# Patient Record
Sex: Male | Born: 1970 | Race: Black or African American | Hispanic: No | Marital: Married | Smoking: Never smoker
Health system: Southern US, Community
[De-identification: ages and names within clinical notes are randomized; demographics above are authoritative.]

## PROBLEM LIST (undated history)

## (undated) DIAGNOSIS — J4520 Mild intermittent asthma, uncomplicated: Secondary | ICD-10-CM

## (undated) HISTORY — DX: Mild intermittent asthma, uncomplicated: J45.20

---

## 2010-10-13 ENCOUNTER — Other Ambulatory Visit: Payer: Self-pay | Admitting: Family Medicine

## 2010-10-13 ENCOUNTER — Ambulatory Visit
Admission: RE | Admit: 2010-10-13 | Discharge: 2010-10-13 | Disposition: A | Discharge status: Home / Self Care | Payer: BC Managed Care – PPO | Source: Ambulatory Visit | Attending: Family Medicine | Admitting: Family Medicine

## 2010-10-13 DIAGNOSIS — M25562 Pain in left knee: Secondary | ICD-10-CM

## 2012-11-13 ENCOUNTER — Encounter (HOSPITAL_BASED_OUTPATIENT_CLINIC_OR_DEPARTMENT_OTHER): Payer: Self-pay | Admitting: *Deleted

## 2012-11-13 ENCOUNTER — Emergency Department (HOSPITAL_BASED_OUTPATIENT_CLINIC_OR_DEPARTMENT_OTHER): Payer: BC Managed Care – PPO

## 2012-11-13 ENCOUNTER — Emergency Department (HOSPITAL_BASED_OUTPATIENT_CLINIC_OR_DEPARTMENT_OTHER)
Admission: EM | Admit: 2012-11-13 | Discharge: 2012-11-13 | Disposition: A | Discharge status: Home / Self Care | Payer: BC Managed Care – PPO | Attending: Emergency Medicine | Admitting: Emergency Medicine

## 2012-11-13 DIAGNOSIS — S0990XA Unspecified injury of head, initial encounter: Secondary | ICD-10-CM

## 2012-11-13 DIAGNOSIS — Y9389 Activity, other specified: Secondary | ICD-10-CM | POA: Insufficient documentation

## 2012-11-13 DIAGNOSIS — Y92410 Unspecified street and highway as the place of occurrence of the external cause: Secondary | ICD-10-CM | POA: Insufficient documentation
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
## 2013-07-29 ENCOUNTER — Encounter (HOSPITAL_COMMUNITY): Payer: Self-pay | Admitting: Emergency Medicine

## 2013-07-29 ENCOUNTER — Emergency Department (INDEPENDENT_AMBULATORY_CARE_PROVIDER_SITE_OTHER)
Admission: EM | Admit: 2013-07-29 | Discharge: 2013-07-29 | Disposition: A | Discharge status: Nursing Home (Includes ICF) | Payer: BC Managed Care – PPO | Source: Home / Self Care | Attending: Family Medicine | Admitting: Family Medicine

## 2013-07-29 ENCOUNTER — Emergency Department (HOSPITAL_COMMUNITY)
Admission: EM | Admit: 2013-07-29 | Discharge: 2013-07-29 | Disposition: A | Discharge status: Home / Self Care | Payer: BC Managed Care – PPO | Attending: Emergency Medicine | Admitting: Emergency Medicine

## 2013-07-29 ENCOUNTER — Emergency Department (HOSPITAL_COMMUNITY): Payer: BC Managed Care – PPO

## 2013-07-29 DIAGNOSIS — R079 Chest pain, unspecified: Secondary | ICD-10-CM

## 2013-07-29 DIAGNOSIS — R0789 Other chest pain: Secondary | ICD-10-CM | POA: Insufficient documentation

## 2013-07-29 LAB — CBC WITH DIFFERENTIAL/PLATELET
BASOS PCT: 0 % (ref 0–1)
Basophils Absolute: 0 10*3/uL (ref 0.0–0.1)
EOS PCT: 1 % (ref 0–5)
Eosinophils Absolute: 0.1 10*3/uL (ref 0.0–0.7)
HCT: 42 % (ref 39.0–52.0)
HEMOGLOBIN: 14.6 g/dL (ref 13.0–17.0)
LYMPHS PCT: 13 % (ref 12–46)
Lymphs Abs: 1.2 10*3/uL (ref 0.7–4.0)
MCH: 30.7 pg (ref 26.0–34.0)
MCHC: 34.8 g/dL (ref 30.0–36.0)
MCV: 88.4 fL (ref 78.0–100.0)
MONO ABS: 0.7 10*3/uL (ref 0.1–1.0)
Monocytes Relative: 7 % (ref 3–12)
Neutro Abs: 7.3 10*3/uL (ref 1.7–7.7)
Neutrophils Relative %: 79 % — ABNORMAL HIGH (ref 43–77)
PLATELETS: 257 10*3/uL (ref 150–400)
RBC: 4.75 MIL/uL (ref 4.22–5.81)
RDW: 13.2 % (ref 11.5–15.5)
WBC: 9.3 10*3/uL (ref 4.0–10.5)

## 2013-07-29 LAB — I-STAT CHEM 8, ED
BUN: 9 mg/dL (ref 6–23)
CREATININE: 1.3 mg/dL (ref 0.50–1.35)
Calcium, Ion: 1.24 mmol/L — ABNORMAL HIGH (ref 1.12–1.23)
Chloride: 102 mEq/L (ref 96–112)
Glucose, Bld: 103 mg/dL — ABNORMAL HIGH (ref 70–99)
HCT: 43 % (ref 39.0–52.0)
HEMOGLOBIN: 14.6 g/dL (ref 13.0–17.0)
POTASSIUM: 4.5 meq/L (ref 3.7–5.3)
Sodium: 142 mEq/L (ref 137–147)
TCO2: 28 mmol/L (ref 0–100)

## 2013-07-29 LAB — TROPONIN I: Troponin I: <0.3 ng/mL (ref <0.30)

## 2013-07-29 LAB — I-STAT TROPONIN, ED: TROPONIN I, POC: 0.01 ng/mL (ref 0.00–0.08)

## 2013-07-29 LAB — D-DIMER, QUANTITATIVE: D-Dimer, Quant: <0.27 ug/mL-FEU (ref 0.00–0.48)

## 2013-07-29 MED ORDER — NITROGLYCERIN 0.4 MG SL SUBL
SUBLINGUAL_TABLET | SUBLINGUAL | Status: AC
  Filled 2013-07-29: qty 1

## 2013-07-29 MED ORDER — ASPIRIN 81 MG PO CHEW
CHEWABLE_TABLET | ORAL | Status: AC
  Filled 2013-07-29: qty 4

## 2013-07-29 MED ORDER — SODIUM CHLORIDE 0.9 % IV SOLN
Freq: Once | INTRAVENOUS | Status: AC
  Administered 2013-07-29: 11:00:00 via INTRAVENOUS

## 2013-07-29 MED ORDER — GI COCKTAIL ~~LOC~~
30.0000 mL | Freq: Once | ORAL | Status: AC
  Administered 2013-07-29: 30 mL via ORAL
  Filled 2013-07-29: qty 30

## 2013-07-29 MED ORDER — ASPIRIN 81 MG PO CHEW
324.0000 mg | CHEWABLE_TABLET | Freq: Once | ORAL | Status: AC
  Administered 2013-07-29: 324 mg via ORAL

## 2013-07-29 MED ORDER — NITROGLYCERIN 0.4 MG SL SUBL
0.4000 mg | SUBLINGUAL_TABLET | SUBLINGUAL | Status: DC | PRN
  Administered 2013-07-29: 0.4 mg via SUBLINGUAL

## 2013-07-29 NOTE — ED Notes (Signed)
Per pt, pt reports having chest pain.  Was woken by the pain, initially thought it was gas.  Pt reports pain is located lower chest/upper abdomen.  Pt describes pain as tight and sharp.  Sharpness occurred around 9am and lasted about half hour.  Reports currently just feeling tight and uncomfortable (5/10 pain rating).  Denies n/v/d, diaphoresis.  Pt reports some tremors and clamminess, but it passed within a few minutes (pt attributed it to anxiety).  Pt denies hx of chest pain, heart problems.  Denies hx htn and DM.  Pt reports difficulty taking a deep breath.  No apparent distress.  Skin warm and dry.  Respirations equal and unlabored.  [facility]

## 2013-07-29 NOTE — Discharge Instructions (Signed)
Chest Pain (Nonspecific) °It is often hard to give a specific diagnosis for the cause of chest pain. There is always a chance that your pain could be related to something serious, such as a heart attack or a blood clot in the lungs. You need to follow up with your caregiver for further evaluation. °CAUSES  °· Heartburn. °· Pneumonia or bronchitis. °· Anxiety or stress. °· Inflammation around your heart (pericarditis) or lung (pleuritis or pleurisy). °· A blood clot in the lung. °· A collapsed lung (pneumothorax). It can develop suddenly on its own (spontaneous pneumothorax) or from injury (trauma) to the chest. °· Shingles infection (herpes zoster virus). °The chest wall is composed of bones, muscles, and cartilage. Any of these can be the source of the pain. °· The bones can be bruised by injury. °· The muscles or cartilage can be strained by coughing or overwork. °· The cartilage can be affected by inflammation and become sore (costochondritis). °DIAGNOSIS  °Lab tests or other studies, such as X-rays, electrocardiography, stress testing, or cardiac imaging, may be needed to find the cause of your pain.  °TREATMENT  °· Treatment depends on what may be causing your chest pain. Treatment may include: °· Acid blockers for heartburn. °· Anti-inflammatory medicine. °· Pain medicine for inflammatory conditions. °· Antibiotics if an infection is present. °· You may be advised to change lifestyle habits. This includes stopping smoking and avoiding alcohol, caffeine, and chocolate. °· You may be advised to keep your head raised (elevated) when sleeping. This reduces the chance of acid going backward from your stomach into your esophagus. °· Most of the time, nonspecific chest pain will improve within 2 to 3 days with rest and mild pain medicine. °HOME CARE INSTRUCTIONS  °· If antibiotics were prescribed, take your antibiotics as directed. Finish them even if you start to feel better. °· For the next few days, avoid physical  activities that bring on chest pain. Continue physical activities as directed. °· Do not smoke. °· Avoid drinking alcohol. °· Only take over-the-counter or prescription medicine for pain, discomfort, or fever as directed by your caregiver. °· Follow your caregiver's suggestions for further testing if your chest pain does not go away. °· Keep any follow-up appointments you made. If you do not go to an appointment, you could develop lasting (chronic) problems with pain. If there is any problem keeping an appointment, you must call to reschedule. °SEEK MEDICAL CARE IF:  °· You think you are having problems from the medicine you are taking. Read your medicine instructions carefully. °· Your chest pain does not go away, even after treatment. °· You develop a rash with blisters on your chest. °SEEK IMMEDIATE MEDICAL CARE IF:  °· You have increased chest pain or pain that spreads to your arm, neck, jaw, back, or abdomen. °· You develop shortness of breath, an increasing cough, or you are coughing up blood. °· You have severe back or abdominal pain, feel nauseous, or vomit. °· You develop severe weakness, fainting, or chills. °· You have a fever. °THIS IS AN EMERGENCY. Do not wait to see if the pain will go away. Get medical help at once. Call your local emergency services (911 in [place] Do not drive yourself to the hospital. °MAKE SURE YOU:  °· Understand these instructions. °· Will watch your condition. °· Will get help right away if you are not doing well or get worse. °Document Released: 02/04/2005 Document Revised: 07/20/2011 Document Reviewed: 12/01/2007 °ExitCare® Patient Information ©2014 [employer]   [employer]. ° °

## 2013-07-29 NOTE — ED Notes (Signed)
Patient has epigastric pain that woke him around 2:30 am.  No nausea, no vomiting, no diarrhea.  Initially reported as constant, but reports several times this am that pain subsided.  Pain is more intense now than it was initially.  Reports normal bm this am and did improve .  Pain is in front of body, no pain in back.  Pain is described as "just there", no sharp.  Pain makes it difficult to take a deep breath.  No diaphoresis.

## 2013-07-29 NOTE — ED Notes (Signed)
Notified [facility] 

## 2013-07-29 NOTE — ED Provider Notes (Signed)
CSN: [id number]     Arrival date & time 07/29/13  1133 History   First MD Initiated Contact with Patient 07/29/13 1246     Chief Complaint  Patient presents with  . Chest Pain     (Consider location/radiation/quality/duration/timing/severity/associated sxs/prior Treatment) HPI Comments: Patient presents with chest pain. He states that started about 2 to this morning and is been intermittent since that time. Hasn't ever completely gone away but it waxes and wanes in intensity. He describes it as an aching across his upper abdomen that radiated to his right shoulder. He states right now he has only a very mild ache in his right shoulder area. He denies any shortness of breath dizziness, nausea, or diaphoresis. There is no pleuritic nature to the pain. He denies any leg pain or swelling. He describes as an aching across his chest. It's not related to movement not related to breathing. He took some gas pills without relief. He was first seen in urgent care Center and transferred over here. He was given baby aspirin nitroglycerin at urgent care Center. He did not note any improvement with the symptoms after the nitroglycerin. He denies any known family history of early heart disease. He has a slight cholesterol problem he states. He denies any history of hypertension or diabetes. He denies any tobacco use.  Patient is a 43 y.o. male presenting with chest pain.  Chest Pain Associated symptoms: no abdominal pain, no back pain, no cough, no diaphoresis, no dizziness, no fatigue, no fever, no headache, no nausea, no numbness, no shortness of breath, not vomiting and no weakness     History reviewed. No pertinent past medical history. History reviewed. No pertinent past surgical history. History reviewed. No pertinent family history. History  Substance Use Topics  . Smoking status: Never Smoker   . Smokeless tobacco: Not on file  . Alcohol Use: No    Review of Systems  Constitutional: Negative for  fever, chills, diaphoresis and fatigue.  HENT: Negative for congestion, rhinorrhea and sneezing.   Eyes: Negative.   Respiratory: Negative for cough, chest tightness and shortness of breath.   Cardiovascular: Positive for chest pain. Negative for leg swelling.  Gastrointestinal: Negative for nausea, vomiting, abdominal pain, diarrhea and blood in stool.  Genitourinary: Negative for frequency, hematuria, flank pain and difficulty urinating.  Musculoskeletal: Negative for arthralgias and back pain.  Skin: Negative for rash.  Neurological: Negative for dizziness, speech difficulty, weakness, numbness and headaches.      Allergies  Fruit & vegetable daily  Home Medications   Current Outpatient Rx  Name  Route  Sig  Dispense  Refill  . OVER THE COUNTER MEDICATION      Over the counter gas medicine         . Pseudoephedrine-Ibuprofen (ADVIL COLD/SINUS PO)   Oral   Take 1 tablet by mouth 2 (two) times daily as needed (for allergies).          BP 126/86  Pulse 99  Temp(Src) 98.7 F (37.1 C) (Oral)  Resp 21  Ht 5\' 8"  (1.727 m)  Wt 176 lb (79.833 kg)  BMI 26.77 kg/m2  SpO2 98% Physical Exam  Constitutional: He is oriented to person, place, and time. He appears well-developed and well-nourished.  HENT:  Head: Normocephalic and atraumatic.  Eyes: Pupils are equal, round, and reactive to light.  Neck: Normal range of motion. Neck supple.  Cardiovascular: Normal rate, regular rhythm and normal heart sounds.   Pulmonary/Chest: Effort normal and breath sounds  normal. No respiratory distress. He has no wheezes. He has no rales. He exhibits no tenderness.  Abdominal: Soft. Bowel sounds are normal. There is no tenderness. There is no rebound and no guarding.  Musculoskeletal: Normal range of motion. He exhibits no edema.  Lymphadenopathy:    He has no cervical adenopathy.  Neurological: He is alert and oriented to person, place, and time.  Skin: Skin is warm and dry. No rash  noted.  Psychiatric: He has a normal mood and affect.    ED Course  Procedures (including critical care time) Labs Review Results for orders placed during the hospital encounter of 07/29/13  CBC WITH DIFFERENTIAL      Result Value Ref Range   WBC 9.3  4.0 - 10.5 K/uL   RBC 4.75  4.22 - 5.81 MIL/uL   Hemoglobin 14.6  13.0 - 17.0 g/dL   HCT 16.1  09.6 - 04.5 %   MCV 88.4  78.0 - 100.0 fL   MCH 30.7  26.0 - 34.0 pg   MCHC 34.8  30.0 - 36.0 g/dL   RDW 40.9  81.1 - 91.4 %   Platelets 257  150 - 400 K/uL   Neutrophils Relative % 79 (*) 43 - 77 %   Lymphocytes Relative 13  12 - 46 %   Monocytes Relative 7  3 - 12 %   Eosinophils Relative 1  0 - 5 %   Basophils Relative 0  0 - 1 %   Neutro Abs 7.3  1.7 - 7.7 K/uL   Lymphs Abs 1.2  0.7 - 4.0 K/uL   Monocytes Absolute 0.7  0.1 - 1.0 K/uL   Eosinophils Absolute 0.1  0.0 - 0.7 K/uL   Basophils Absolute 0.0  0.0 - 0.1 K/uL   Smear Review MORPHOLOGY UNREMARKABLE    TROPONIN I      Result Value Ref Range   Troponin I <0.30  <0.30 ng/mL  D-DIMER, QUANTITATIVE      Result Value Ref Range   D-Dimer, Quant <0.27  0.00 - 0.48 ug/mL-FEU  I-STAT CHEM 8, ED      Result Value Ref Range   Sodium 142  137 - 147 mEq/L   Potassium 4.5  3.7 - 5.3 mEq/L   Chloride 102  96 - 112 mEq/L   BUN 9  6 - 23 mg/dL   Creatinine, Ser 7.82  0.50 - 1.35 mg/dL   Glucose, Bld 956 (*) 70 - 99 mg/dL   Calcium, Ion 2.13 (*) 1.12 - 1.23 mmol/L   TCO2 28  0 - 100 mmol/L   Hemoglobin 14.6  13.0 - 17.0 g/dL   HCT 08.6  57.8 - 46.9 %  I-STAT TROPOININ, ED      Result Value Ref Range   Troponin i, poc 0.01  0.00 - 0.08 ng/mL   Comment 3            Dg Chest 2 View  07/29/2013   CLINICAL DATA:  Chest/left arm pain, shortness of breath  EXAM: CHEST  2 VIEW  COMPARISON:  None.  FINDINGS: Lungs are clear. No pleural effusion or pneumothorax.  The heart is normal in size.  Visualized osseous structures are within normal limits.  IMPRESSION: No evidence of acute  cardiopulmonary disease.   Electronically Signed   By: Charline Bills M.D.   On: 07/29/2013 13:13    Date: 07/29/2013  Rate: 73  Rhythm: normal sinus rhythm  QRS Axis: normal  Intervals: normal  ST/T Wave  abnormalities: Jpoint elevation V2.  Conduction Disutrbances:none  Narrative Interpretation:   Old EKG Reviewed: none available    Imaging Review Dg Chest 2 View  07/29/2013   CLINICAL DATA:  Chest/left arm pain, shortness of breath  EXAM: CHEST  2 VIEW  COMPARISON:  None.  FINDINGS: Lungs are clear. No pleural effusion or pneumothorax.  The heart is normal in size.  Visualized osseous structures are within normal limits.  IMPRESSION: No evidence of acute cardiopulmonary disease.   Electronically Signed   By: Charline Bills M.D.   On: 07/29/2013 13:13     EKG Interpretation None      MDM   Final diagnoses:  Chest pain    Pt presents with atypical CP.  No other associated symptoms.  To me, he denied n/v/diaphoresis, SOB.  Pain worse with inspiration, but on other findings suggestive of PE.  No PTX, pneumonia.  Pt with a HEART score of 3 (low risk).  2 EKGs done and unchanged.  He has elevation in one lead that appears to be j point elevation.  No reciprocal changes.  No change in pain with nitro, GI cocktail.  Pt with minimal symptoms now.  Had 2 neg troponins.  Will d/c.  Pt has good f/u.  Will f/u with his PCP on Monday.  Given strict return precautions.    Rolan Bucco, MD 07/29/13 2015

## 2013-07-29 NOTE — ED Provider Notes (Signed)
CSN: [id number]     Arrival date & time 07/29/13  [id number] History   First MD Initiated Contact with Patient 07/29/13 1052     Chief Complaint  Patient presents with  . Abdominal Pain   (Consider location/radiation/quality/duration/timing/severity/associated sxs/prior Treatment) Patient is a 43 y.o. male presenting with chest pain. The history is provided by the patient.  Chest Pain Pain location:  Epigastric and substernal area Pain quality: aching and pressure   Pain radiates to:  Precordial region, L shoulder and R shoulder (in band-like distribution across upper abdomen) Pain radiates to the back: no   Pain severity:  Moderate Onset quality:  Gradual (Woke patient from sleep at 2:30am.) Progression:  Waxing and waning Chronicity:  New Ineffective treatments:  Antacids (Tried dose of simethicone this morning.) Associated symptoms: diaphoresis   Associated symptoms: no dizziness, no heartburn, no nausea, no near-syncope, no palpitations, no shortness of breath, not vomiting and no weakness   Associated symptoms comment:  Feels "clammy" when symptoms intensify.   History reviewed. No pertinent past medical history. History reviewed. No pertinent past surgical history. No family history on file. History  Substance Use Topics  . Smoking status: Never Smoker   . Smokeless tobacco: Not on file  . Alcohol Use: No    Review of Systems  Constitutional: Positive for diaphoresis.  Respiratory: Negative for shortness of breath.   Cardiovascular: Positive for chest pain. Negative for palpitations and near-syncope.  Gastrointestinal: Negative for heartburn, nausea and vomiting.  Neurological: Negative for dizziness and weakness.  All other systems reviewed and are negative.    Allergies  Review of patient's allergies indicates no known allergies.  Home Medications   Current Outpatient Rx  Name  Route  Sig  Dispense  Refill  . OVER THE COUNTER MEDICATION      Over the counter gas  medicine          BP 134/93  Pulse 81  Temp(Src) 97.1 F (36.2 C) (Oral)  Resp 20  SpO2 99% Physical Exam  Nursing note and vitals reviewed. Constitutional: He is oriented to person, place, and time. He appears well-developed and well-nourished. No distress.  HENT:  Head: Normocephalic and atraumatic.  Eyes: Conjunctivae are normal.  Neck: Normal range of motion. Neck supple. No JVD present.  Cardiovascular: Normal rate, regular rhythm and normal heart sounds.   Pulmonary/Chest: Effort normal and breath sounds normal. No respiratory distress. He has no wheezes.  Abdominal: Soft. Bowel sounds are normal. He exhibits no distension. There is no tenderness.  Musculoskeletal: Normal range of motion. He exhibits no edema.  Neurological: He is alert and oriented to person, place, and time.  Skin: Skin is warm and dry.  Psychiatric: He has a normal mood and affect. His behavior is normal.    ED Course  Procedures (including critical care time) Labs Review Labs Reviewed - No data to display Imaging Review No results found.   MDM   1. Chest pain   43 y/o AA male with band-like pressure/discomfort at lower chest and upper abdomen since 2:30 am. TIMI= 0. No family hx of early heart disease. ECG: NSR @ 80 bpm with ST elevation v1-v5 (?J point elevation?). ECG reviewed with Dr. Denyse Amassorey and [facility].     Jess BartersJennifer Lee LinndalePresson, [place] 07/29/13 1101

## 2013-07-31 NOTE — ED Provider Notes (Signed)
Medical screening examination/treatment/procedure(s) were performed by a resident physician or non-physician practitioner and as the supervising physician I was immediately available for consultation/collaboration.  Evan Corey, MD    Evan S Corey, MD 07/31/13 0758 

## 2014-05-29 ENCOUNTER — Other Ambulatory Visit: Payer: Self-pay | Admitting: Family Medicine

## 2014-05-29 DIAGNOSIS — M545 Low back pain: Secondary | ICD-10-CM

## 2014-06-09 ENCOUNTER — Other Ambulatory Visit: Payer: BC Managed Care – PPO

## 2014-06-12 ENCOUNTER — Ambulatory Visit
Admission: RE | Admit: 2014-06-12 | Discharge: 2014-06-12 | Disposition: A | Discharge status: Home / Self Care | Payer: BC Managed Care – PPO | Source: Ambulatory Visit | Attending: Family Medicine | Admitting: Family Medicine

## 2014-06-12 DIAGNOSIS — M545 Low back pain: Secondary | ICD-10-CM

## 2015-06-28 ENCOUNTER — Encounter: Payer: Self-pay | Admitting: Allergy and Immunology

## 2015-06-28 ENCOUNTER — Ambulatory Visit (INDEPENDENT_AMBULATORY_CARE_PROVIDER_SITE_OTHER): Payer: BC Managed Care – PPO | Admitting: Allergy and Immunology

## 2015-06-28 VITALS — BP 114/70 | HR 76 | Temp 98.2°F | Resp 16 | Ht 68.9 in | Wt 181.2 lb

## 2015-06-28 DIAGNOSIS — R05 Cough: Secondary | ICD-10-CM

## 2015-06-28 DIAGNOSIS — R062 Wheezing: Secondary | ICD-10-CM | POA: Diagnosis not present

## 2015-06-28 DIAGNOSIS — H1010 Acute atopic conjunctivitis, unspecified eye: Secondary | ICD-10-CM | POA: Diagnosis not present

## 2015-06-28 DIAGNOSIS — R059 Cough, unspecified: Secondary | ICD-10-CM

## 2015-06-28 DIAGNOSIS — J309 Allergic rhinitis, unspecified: Secondary | ICD-10-CM | POA: Diagnosis not present

## 2015-06-28 MED ORDER — MOMETASONE FUROATE 50 MCG/ACT NA SUSP: NASAL | Status: DC

## 2015-06-28 MED ORDER — ALBUTEROL SULFATE 108 (90 BASE) MCG/ACT IN AEPB: 90.0000 ug | INHALATION_SPRAY | RESPIRATORY_TRACT | Status: DC | PRN

## 2015-06-28 MED ORDER — LEVOCETIRIZINE DIHYDROCHLORIDE 5 MG PO TABS: 5.0000 mg | ORAL_TABLET | Freq: Every evening | ORAL | Status: DC

## 2015-06-28 NOTE — Patient Instructions (Signed)
Take Home Sheet  1. Avoidance: Mite, Mold and Pollen   2. Antihistamine: Xyzal  by mouth once daily for runny nose or itching.   3. Nasal Spray:  Nasonex 1-2 spray(s) each nostril once daily for stuffy nose or drainage.    4. Inhalers:  Rescue: ProAir respiclick 2 puffs every 4 hours as needed for cough or wheeze.       -May use 2 puffs 10-20 minutes prior to exercise.    5. Eye Drops: Zaditor one drop(s) each eye twice daily for itchy eyes as needed.   6. Other: Begin Singulair  each evening.       Information on allergy injections.  7. Nasal Saline wash each evening at shower time and as needed after mowing grass   8. Follow up Visit: 1-2 months or sooner if needed.   Websites that have reliable Patient information: 1. American Academy of Asthma, Allergy, & Immunology: www.aaaai.org 2. [employer] Allergy Network: www.foodallergy.org 3. Mothers of Asthmatics: www.aanma.org 4. [facility]: https://www.strong.com/ 5. American College of Allergy, Asthma, & Immunology: BiggerRewards.is or www.acaai.org

## 2015-06-30 NOTE — Progress Notes (Signed)
NEW PATIENT NOTE  RE: Shane Barnett MRN: [id number] DOB: 1970/06/09 [facility] [place] [address] [place] [address] Date of Office Visit: 06/28/2015   Subjective:  Shane Barnett is a 45 y.o. male who presents today for Nasal Congestion  Assessment:   1. Cough, intermittent, afebrile in no respiratory distress with excellent in office spirometry.    2. History of Wheeze associated with grass aeroallergen exposure, question of exercise-induced bronchospasm.    3. Allergic rhinoconjunctivitis, going into greater symptomatic season.    4.      History consistent with oral pollinosis syndrome. Plan:   Meds ordered this encounter  Medications  . levocetirizine (XYZAL) 5 MG tablet    Sig: Take 1 tablet (5 mg total) by mouth every evening.    Dispense:  30 tablet    Refill:  5  . mometasone (NASONEX) 50 MCG/ACT nasal spray    Sig: Use 1-2 Sprays each nostril once daily for stuffy nose or drainage.    Dispense:  17 g    Refill:  5  . Albuterol Sulfate (PROAIR RESPICLICK) 108 (90 Base) MCG/ACT AEPB    Sig: Inhale 90 mcg into the lungs every 4 (four) hours as needed (for cough or wheeze.  May use 2 puffs 10-20 minutes prior to exercise.).    Dispense:  1 each    Refill:  1   Patient Instructions  1. Avoidance: Mite, Mold and Pollen 2. Antihistamine: Xyzal  by mouth once daily for runny nose or itching. 3. Nasal Spray:  Nasonex 1-2 spray(s) each nostril once daily for stuffy nose or drainage.  4. Inhalers:  Rescue: ProAir respiclick 2 puffs every 4 hours as needed for cough or wheeze.       -May use 2 puffs 10-20 minutes prior to exercise. 5. Eye Drops: Zaditor one drop(s) each eye twice daily for itchy eyes as needed. 6. Other: Begin Singulair  each evening.       Information on allergy injections 7. Nasal Saline wash each evening at shower time and as needed after mowing grass 8. Follow up Visit: 1-2 months or sooner if needed.  HPI:  Shane Barnett presents to the office in initial evaluation of 40 year history of recurring upper and lower respiratory symptoms (saw Dr. Beaulah Dinning 10 years ago).  He reports history of rhinorrhea, congestion, sneezing, itchy watery eyes, postnasal drip, and cough, which on occasion has been associated with wheeze.  He describes his greatest symptoms in the spring but mild year-round symptoms.  More recently outdoors, fluctuant weather patterns, in particular grass pollen appear to be the greatest provoking factors to symptoms, especially wheeze when he may use albuterol prior to cutting the grass.  He recalls occasional exercise induced symptoms, and snoring, but no difficulty breathing, shortness of breath, tightness or chest congestion.  He avoids most fresh fruits in particular apple, peach, strawberry and pineapple due to oral itching but does recall tolerating grapes.  No episodes of lip, tongue, throat, swelling or hives.  He recently has used a few over-the-counter medications (Flonase) which have been of some benefit but has not found great relief with Zyrtec, Claritin or Allegra.  Denies ED or Urgent care visits, prednisone or antibiotic courses.  He is very much interested in more intense management as we move into the spring season.  Previously thought to schedule was to be moving to initiate immunotherapy.  Medical History: No hospitalizations. History reviewed. No pertinent past medical history. Surgical History: History reviewed.  No pertinent past surgical history. Family History: Family History  Problem Relation Age of Onset  . Angioedema Neg Hx   . Asthma Neg Hx   . Eczema Neg Hx   . Immunodeficiency Neg Hx   . Urticaria Neg Hx   . Allergic rhinitis Mother   . Allergic rhinitis Sister    Social History: Social History  . Marital Status: Married    Spouse Name: N/A  . Number of Children: N/A  . Years of Education: N/A   Social History Main Topics  . Smoking status: Never Smoker   .  Smokeless tobacco: Never Used  . Alcohol Use: No  . Drug Use: No  . Sexual Activity: Yes   Social History Narrative  Ioane is a [employer], at home with his wife.  Daquarius has a current medication list which includes the following prescription(s): multivitamin.  Drug Allergies: Allergies  Allergen Reactions  . Peach, apple, strawberry, pineapple Oral itching   Environmental History: Nina  lives in a 47 year old house for 20 years, carpeted throughout with central air and heat.  Stuffed mattress, non-feather pillow and comforter.  No humidifier, smokers or pets.  Review of Systems  Constitutional: Negative for fever, weight loss and malaise/fatigue.  HENT: Positive for congestion. Negative for ear pain, hearing loss, nosebleeds and sore throat.   Eyes: Negative for blurred vision, double vision, pain, discharge and redness.       Corrective eyeglasses lenses for 35 years and contacts for 7 years.  Respiratory: Negative for hemoptysis, sputum production and shortness of breath.        See HPI.  Denies history of bronchitis and pneumonia.  Cardiovascular: Negative for chest pain.  Gastrointestinal: Negative for heartburn, nausea, vomiting, abdominal pain, diarrhea and constipation.  Genitourinary: Negative.   Musculoskeletal: Negative for myalgias and joint pain.  Skin: Negative.  Negative for itching and rash.  Neurological: Negative.  Negative for dizziness, seizures, weakness and headaches.  Endo/Heme/Allergies: Positive for environmental allergies.       Avoids most fresh fruits secondary to oral itching--apple, peach, strawberry, pineapple (tolerates grapes). Denies sensitivity to aspirin, NSAIDs, stinging insects, latex, jewelry.  Psychiatric/Behavioral: The patient is not nervous/anxious.   Immunological: Childhood varicella disease and no chronic infectious history.  Objective:   Filed Vitals:   06/28/15 1023  BP: 114/70    Pulse: 76  Temp: 98.2 F (36.8 C)  Resp: 16   Physical Exam  Constitutional: He is well-developed, well-nourished, and in no distress.  HENT:  Head: Atraumatic.  Right Ear: Tympanic membrane and ear canal normal.  Left Ear: Tympanic membrane and ear canal normal.  Nose: Mucosal edema present. No rhinorrhea. No epistaxis.  Mouth/Throat: Oropharynx is clear and moist and mucous membranes are normal. No oropharyngeal exudate, posterior oropharyngeal edema or posterior oropharyngeal erythema.  Eyes: Conjunctivae are normal.  Neck: Neck supple.  Cardiovascular: Normal rate, S1 normal and S2 normal.   No murmur heard. Pulmonary/Chest: Effort normal and breath sounds normal. He has no wheezes. He has no rhonchi. He has no rales.  Abdominal: Soft. Bowel sounds are normal.  Lymphadenopathy:    He has no cervical adenopathy.  Neurological: He is alert.  Skin: Skin is warm and intact. No rash noted. No cyanosis. Nails show no clubbing.   Diagnostics: Spirometry:  FVC  3.59--92%, FEV1  2.76--87%; post bronchodilator improvement FVC  4.00--102%, FEV1 3.01--94%.    Roselyn M. Willa Rough, MD   cc: Darrow Bussing, MD

## 2015-07-01 ENCOUNTER — Encounter: Payer: Self-pay | Admitting: Allergy and Immunology

## 2016-07-16 ENCOUNTER — Encounter: Payer: Self-pay | Admitting: Allergy & Immunology

## 2016-07-16 ENCOUNTER — Ambulatory Visit (INDEPENDENT_AMBULATORY_CARE_PROVIDER_SITE_OTHER): Payer: BC Managed Care – PPO | Admitting: Allergy & Immunology

## 2016-07-16 ENCOUNTER — Encounter (INDEPENDENT_AMBULATORY_CARE_PROVIDER_SITE_OTHER): Payer: Self-pay

## 2016-07-16 DIAGNOSIS — J4520 Mild intermittent asthma, uncomplicated: Secondary | ICD-10-CM

## 2016-07-16 DIAGNOSIS — T781XXD Other adverse food reactions, not elsewhere classified, subsequent encounter: Secondary | ICD-10-CM

## 2016-07-16 DIAGNOSIS — J302 Other seasonal allergic rhinitis: Secondary | ICD-10-CM

## 2016-07-16 NOTE — Patient Instructions (Addendum)
1. Other seasonal allergic rhinitis - We will refill your Nasonex to use only during the worst seasons.  - We will refill your Xyzal (levocetirizine).   2. Pollen-food allergy syndrome - Continue to avoid all of your triggering foods.  - Call us if you would like an epinephrine injector.   3. Mild intermittent asthma, uncomplicated - Continue with albuterol as needed. - There is no need for a controller medication at this time.   4. Return in about 1 year (around 07/16/2017).  Please inform us of any Emergency Department visits, hospitalizations, or changes in symptoms. Call us before going to the ED for breathing or allergy symptoms since we might be able to fit you in for a sick visit. Feel free to contact us anytime with any questions, problems, or concerns.  It was a pleasure to meet you today! Happy spring!   Websites that have reliable patient information: 1. American Academy of Asthma, Allergy, and Immunology: www.aaaai.org 2. Food Allergy Research and Education (FARE): foodallergy.org 3. Mothers of Asthmatics: http://www.asthmacommunitynetwork.org 4. American College of Allergy, Asthma, and Immunology: www.acaai.org

## 2016-07-16 NOTE — Progress Notes (Signed)
FOLLOW UP  Date of Service/Encounter:  07/16/16   Assessment:   Seasonal allergic rhinitis  Pollen-food allergy syndrome  Mild intermittent asthma, uncomplicated   Asthma Reportables:  Severity: intermittent  Risk: low Control: well controlled   Plan/Recommendations:   1. Seasonal allergic rhinitis - We will refill your Nasonex to use only during the worst seasons.  - We will refill your Xyzal (levocetirizine).   2. Pollen-food allergy syndrome - Continue to avoid all of your triggering foods.  - Call us if you would like an epinephrine injector.   3. Mild intermittent asthma, uncomplicated - Continue with albuterol as needed. - There is no need for a controller medication at this time.   4. Return in about 1 year (around 07/16/2017).   Subjective:   Shane Barnett is a 46 y.o. male presenting today for follow up of  Chief Complaint  Patient presents with  . Asthma    Shane Barnett has a history of the following: There are no active problems to display for this patient.   History obtained from: chart review and patient.  Shane Barnett was referred by Shane Bussing, MD.     Shane Barnett is a 46 y.o. male presenting for a follow up visit. He was last seen in February 2017 by Dr. Willa Rough as a new patient.   Since the last visit, he has done well. Shane Barnett's asthma has been well controlled. He has not required rescue medication, experienced nocturnal awakenings due to lower respiratory symptoms, nor have activities of daily living been limited. He does not use a rescue inhaler. He only has problems during the spring season and he premedicates with albuterol prior to mowing.   Today he endorses an achy throat. He does endorse postnasal drip. He tried using Theraflu but then felt that it was related to the allergy season. During this time, he remained afebrile. He is out of his nasal spray which working well but he needs refills.    Otherwise, there have been no changes to  his past medical history, surgical history, family history, or social history. 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 They have no pets, as they had to put their dog down within the last two years.     Review of Systems: a 14-point review of systems is pertinent for what is mentioned in HPI.  Otherwise, all other systems were negative. Constitutional: negative other than that listed in the HPI Eyes: negative other than that listed in the HPI Ears, nose, mouth, throat, and face: negative other than that listed in the HPI Respiratory: negative other than that listed in the HPI Cardiovascular: negative other than that listed in the HPI Gastrointestinal: negative other than that listed in the HPI Genitourinary: negative other than that listed in the HPI Integument: negative other than that listed in the HPI Hematologic: negative other than that listed in the HPI Musculoskeletal: negative other than that listed in the HPI Neurological: negative other than that listed in the HPI Allergy/Immunologic: negative other than that listed in the HPI    Objective:   There were no vitals taken for this visit. There is no height or weight on file to calculate BMI.   Physical Exam:  General: Alert, interactive, in no acute distress. Pleasant well dressed male.  Eyes: No conjunctival injection present on the right, No conjunctival injection present on the left, PERRL bilaterally, No discharge on the right, No discharge on the left and No Horner-Trantas dots  present Ears: Right TM pearly gray with normal light reflex, Left TM pearly gray with normal light reflex, Right TM intact without perforation and Left TM intact without perforation.  Nose/Throat: External nose within normal limits and septum midline, turbinates edematous and pale with clear discharge, post-pharynx erythematous with cobblestoning in the posterior oropharynx. Tonsils 2+ without  exudates Neck: Supple without thyromegaly. Lungs: Clear to auscultation without wheezing, rhonchi or rales. No increased work of breathing. CV: Normal S1/S2, no murmurs. Capillary refill <2 seconds.  Skin: Warm and dry, without lesions or rashes. Neuro:   Grossly intact. No focal deficits appreciated. Responsive to questions.   Diagnostic studies: None    Shane BondsJoel Gallagher, MD [facility] [facility]

## 2016-07-17 ENCOUNTER — Other Ambulatory Visit: Payer: Self-pay | Admitting: Allergy & Immunology

## 2016-07-17 MED ORDER — MOMETASONE FUROATE 50 MCG/ACT NA SUSP: NASAL | 5 refills | Status: DC

## 2016-07-17 MED ORDER — LEVOCETIRIZINE DIHYDROCHLORIDE 5 MG PO TABS: 5.0000 mg | ORAL_TABLET | Freq: Every evening | ORAL | 5 refills | Status: DC

## 2016-07-17 MED ORDER — ALBUTEROL SULFATE 108 (90 BASE) MCG/ACT IN AEPB: 90.0000 ug | INHALATION_SPRAY | RESPIRATORY_TRACT | 1 refills | Status: AC | PRN

## 2016-07-17 NOTE — Telephone Encounter (Signed)
Refills sent

## 2016-07-17 NOTE — Telephone Encounter (Signed)
Patient saw Dr. Dellis AnesGallagher yesterday, 3-8. He was suppose to have 3 medications called into [facility] on [address]; an inhaler, nose spray, and antihistamine to take at night.

## 2016-10-15 IMAGING — MR MR LUMBAR SPINE W/O CM
4 of 5 series · 25 of 48 positions shown · non-contrast
Comparison: None available.

CLINICAL DATA: Initial evaluation for low back pain with associated
right lower extremity pain, all tingling in right leg and foot.

EXAM:
MRI LUMBAR SPINE WITHOUT CONTRAST
TECHNIQUE: Multiplanar, multisequence MR imaging of the lumbar spine was
performed. No intravenous contrast was administered.

[Series 3: T1 · sagittal · 4.0mm · 0.55mm/px · 6 of 13 slices shown (1 of 2)]
[im 1/13]
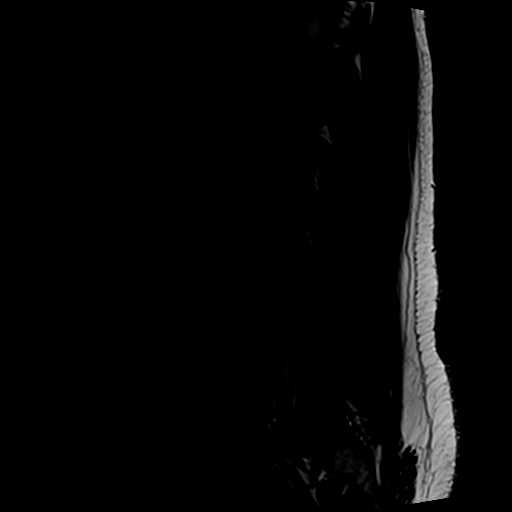
[im 3/13]
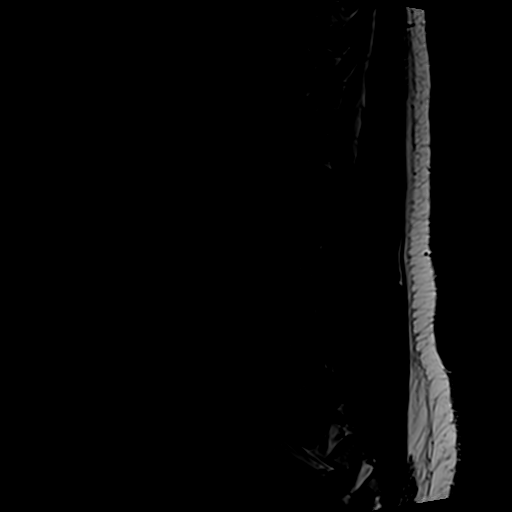
[im 5/13]
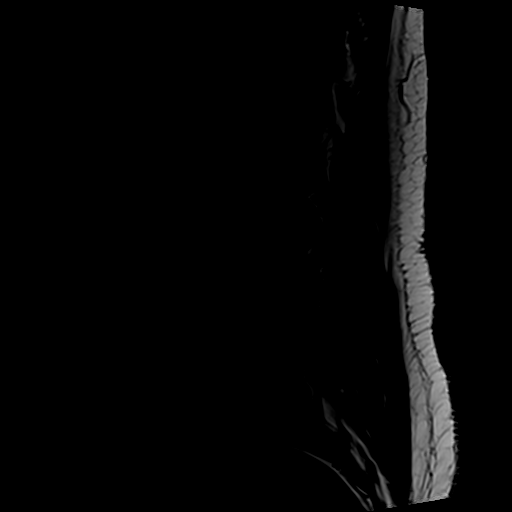
[im 8/13]
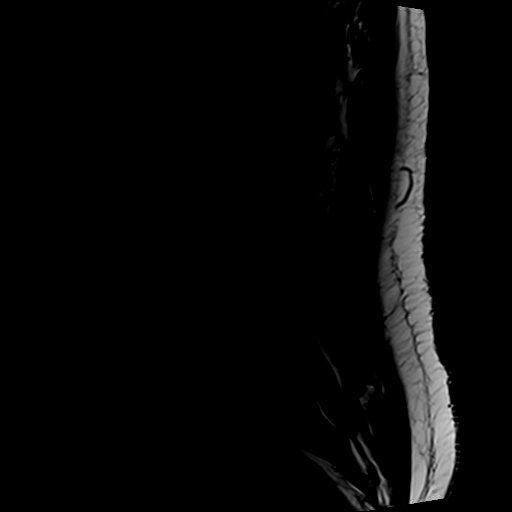
[im 10/13]
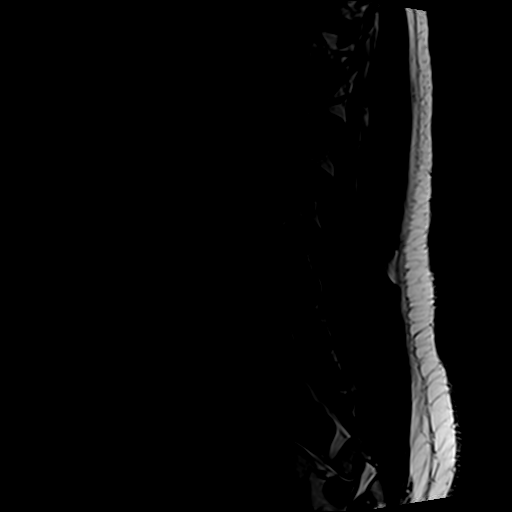
[im 13/13]
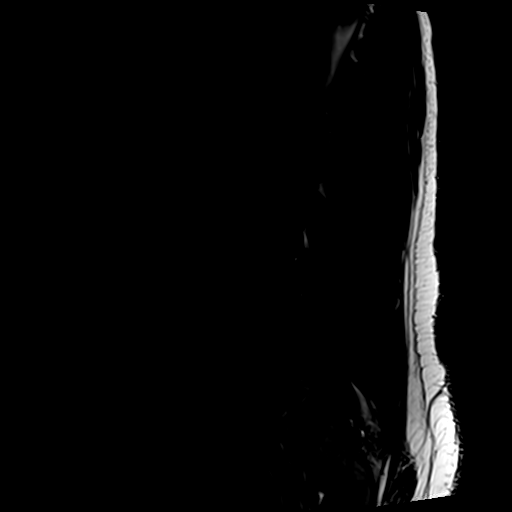

[Series 5: T2 post-contrast · sagittal · 4.0mm · 0.55mm/px · 6 of 13 slices shown]
[im 1/13]
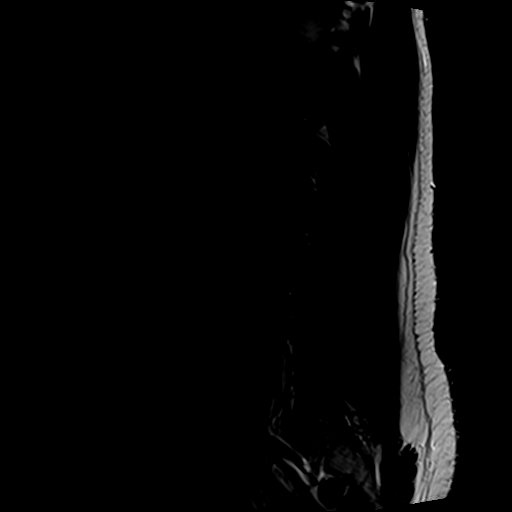
[im 3/13]
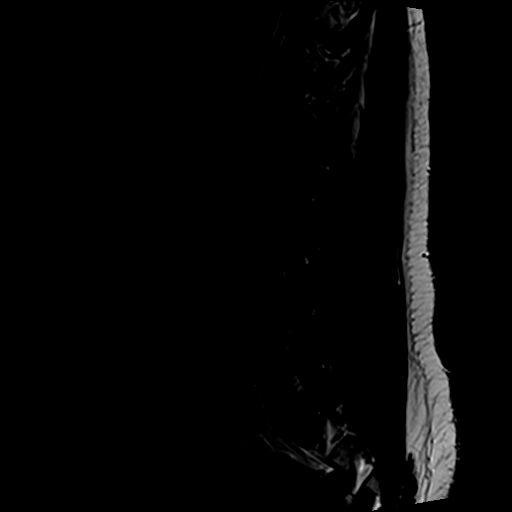
[im 5/13]
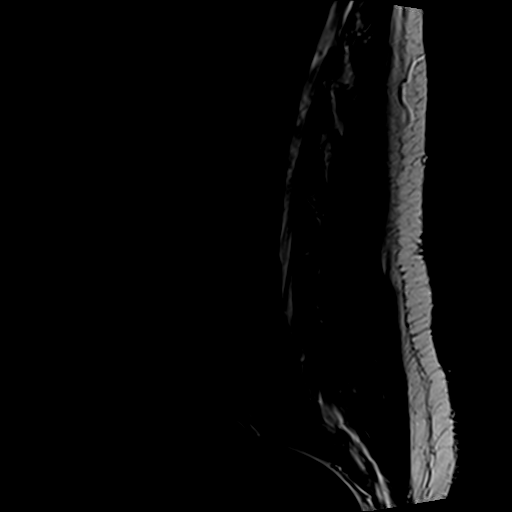
[im 8/13]
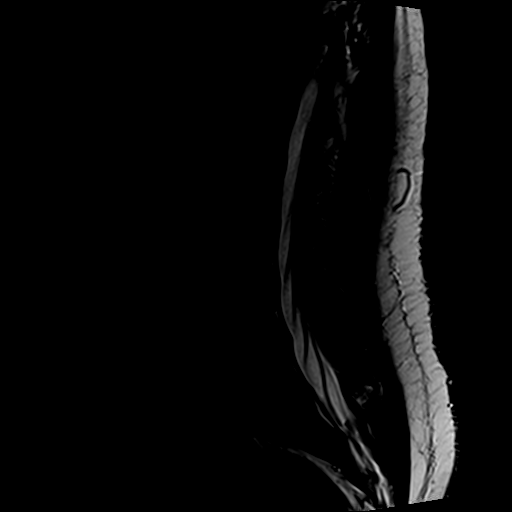
[im 10/13]
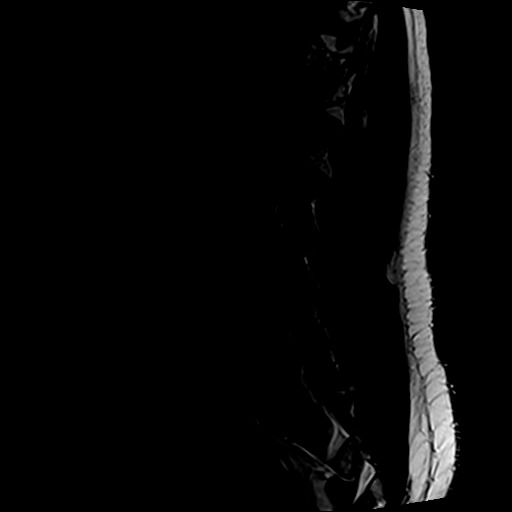
[im 13/13]
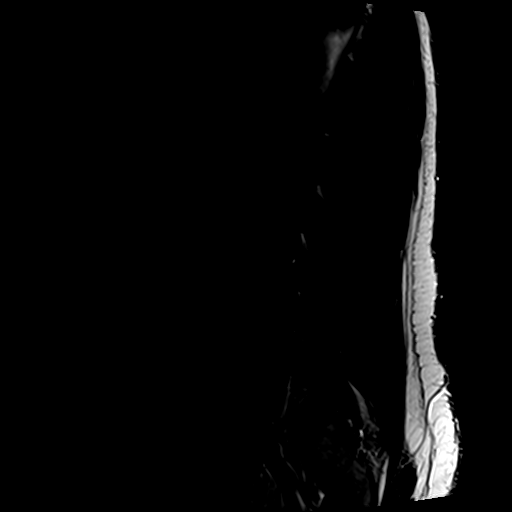

[Series 6: T2 · axial · 4.0mm · 0.70mm/px · z∈[-141,+41]mm · 9 of 33 slices shown]
[im 1/33]
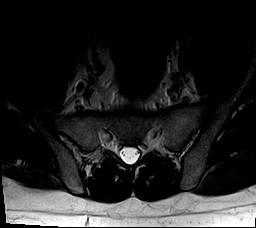
[im 5/33]
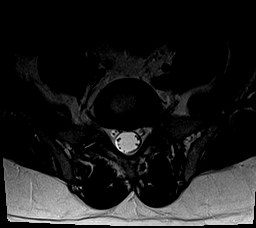
[im 10/33]
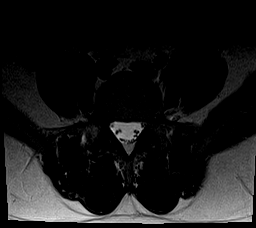
[im 14/33]
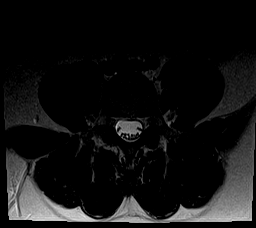
[im 17/33]
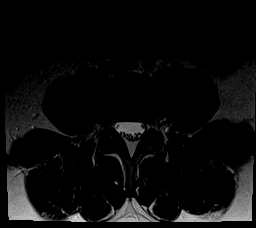
[im 19/33]
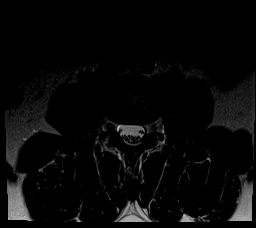
[im 23/33]
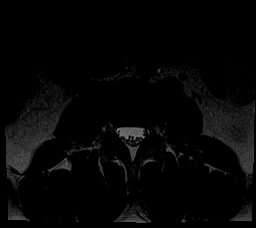
[im 28/33]
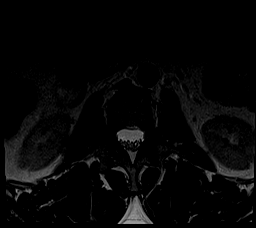
[im 33/33]
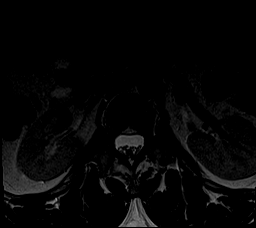

[Series 7: T1 · axial · 4.0mm · 0.35mm/px · z∈[-141,+15]mm · 4 of 33 slices shown (2 of 2)]
[im 1/33]
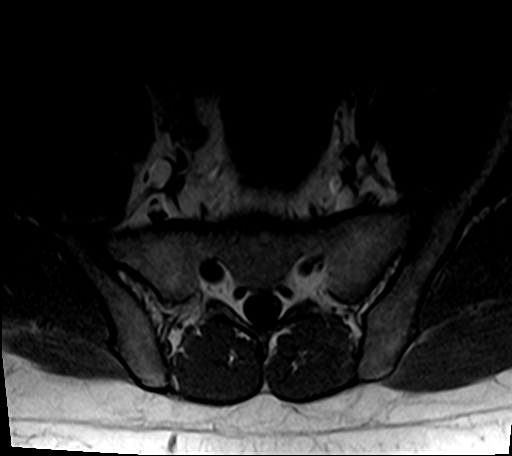
[im 5/33]
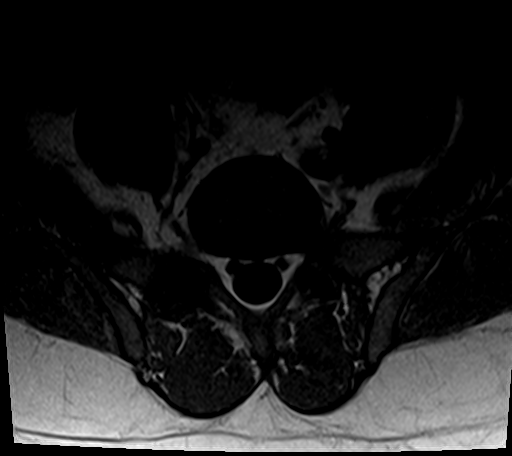
[im 17/33]
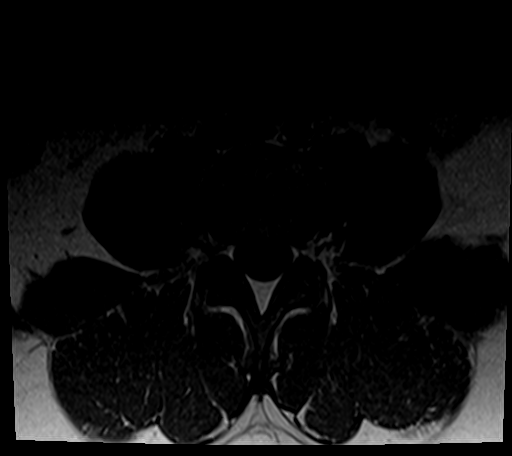
[im 28/33]
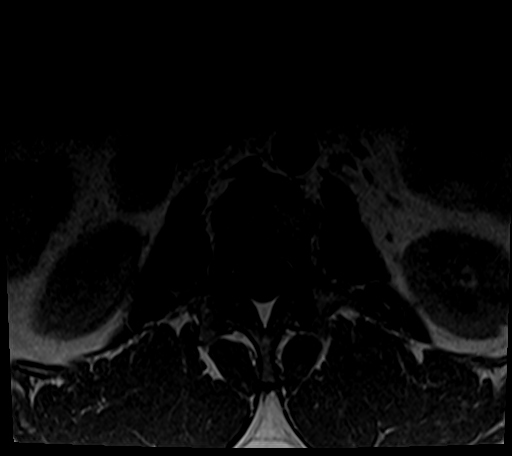

[25 of 48 positions shown; findings below may reference images not displayed]

FINDINGS: For the purposes of this dictation, the lowest well-formed
intervertebral disc spaces presumed to be the L5-S1 level, and there
presumed to be 5 lumbar type vertebral bodies.

The vertebral bodies are normally aligned with preservation of the
normal lumbar lordosis. Vertebral body heights are well maintained.
Signal intensity within the vertebral body bone marrow is normal. No
focal osseous lesion.

The conus medullaris terminates normally at the L1 level. Signal
intensity within the visualized cord is normal. Nerve roots of the
cauda equina within normal limits.

Paraspinous soft tissues are unremarkable. Visualized visceral
structures within normal limits. No retroperitoneal adenopathy.
Visualized aorta is of normal caliber.

There is diffuse congenital shortening of the pedicles, resulting
and congenital diffuse narrowing of the lumbar spine.

At T11-12 thru L3-4, there is no disc bulge or disc protrusion. The
intervertebral discs are well hydrated. No significant facet
arthropathy. No canal or neural foraminal stenosis.

L4-5: Small right foraminal/extra foraminal disc protrusion present,
abutting the right L4 nerve root as it courses out of the right
neural foramen (series 7, image 23). The parent intervertebral disc
remains well hydrated. Intervertebral disc is well hydrated. There
is mild bilateral facet arthrosis, slightly worse on the left.
Foramina remain grossly patent without significant stenosis. No
significant canal narrowing para

L5-S1: No disc bulge or disc protrusion. Intervertebral disc is well
hydrated. There is bilateral facet arthrosis, worse on the right.
There is mild to moderate bilateral foraminal narrowing, slightly
worse on the right.
IMPRESSION: 1. Small right foraminal/extra foraminal disc protrusion at L4-5,
abutting the right L4 nerve root as it courses out of the right
neural foramen. This could potentially result in the patient's right
lower extremity radicular symptoms.
2. Diffuse congenital shortening of the pedicles, resulting in
diffuse congenital narrowing of the lumbar spine.
3. Bilateral facet arthrosis at L5-S1, right greater than, which
superimposed on short pedicles results in mild to moderate bilateral
foraminal narrowing, slightly worse on the right.
4. Mild bilateral facet arthrosis at L4-5, slightly worse on the
left. No associated foraminal stenosis.

## 2017-09-13 ENCOUNTER — Ambulatory Visit: Payer: BC Managed Care – PPO | Admitting: Allergy & Immunology

## 2017-09-13 ENCOUNTER — Encounter: Payer: Self-pay | Admitting: Allergy & Immunology

## 2017-09-13 VITALS — BP 102/68 | HR 86 | Temp 98.3°F | Resp 16 | Ht 68.5 in | Wt 188.0 lb

## 2017-09-13 DIAGNOSIS — J302 Other seasonal allergic rhinitis: Secondary | ICD-10-CM | POA: Diagnosis not present

## 2017-09-13 DIAGNOSIS — R070 Pain in throat: Secondary | ICD-10-CM | POA: Diagnosis not present

## 2017-09-13 DIAGNOSIS — J4520 Mild intermittent asthma, uncomplicated: Secondary | ICD-10-CM | POA: Diagnosis not present

## 2017-09-13 DIAGNOSIS — T781XXD Other adverse food reactions, not elsewhere classified, subsequent encounter: Secondary | ICD-10-CM | POA: Diagnosis not present

## 2017-09-13 HISTORY — DX: Mild intermittent asthma, uncomplicated: J45.20

## 2017-09-13 MED ORDER — MOMETASONE FUROATE 50 MCG/ACT NA SUSP: NASAL | 11 refills | Status: AC

## 2017-09-13 MED ORDER — LEVOCETIRIZINE DIHYDROCHLORIDE 5 MG PO TABS: 5.0000 mg | ORAL_TABLET | Freq: Every evening | ORAL | 11 refills | Status: AC

## 2017-09-13 NOTE — Progress Notes (Signed)
FOLLOW UP  Date of Service/Encounter:  09/13/17   Assessment:   Seasonal allergic rhinitis  Pollen-food allergy syndrome  Mild intermittent asthma, uncomplicated  Throat pain - ? GERD   Asthma Reportables:  Severity: intermittent  Risk: low Control: well controlled   Plan/Recommendations:   1. Seasonal allergic rhinitis - We will refill your Nasonex to use only during the worst seasons.  - We will refill your Xyzal (levocetirizine).   2. Pollen-food allergy syndrome - Continue to avoid all of your triggering foods.  - Call us if you would like an epinephrine injector.   3. Mild intermittent asthma, uncomplicated - Continue with albuterol as needed. - There is no need for a controller medication at this time.   4. Sore throat - There was nothing interesting seen on the exam. - I would recommend using Zantac twice daily for a week to see if that can help.  5. Return in about 1 year (around 09/14/2018).  Subjective:   Larson Limones is a 47 y.o. male presenting today for follow up of  Chief Complaint  Patient presents with  . Allergic Rhinitis     Morse Brueggemann has a history of the following: Patient Active Problem List   Diagnosis Date Noted  . Pollen-food allergy syndrome, subsequent encounter 09/13/2017  . Mild intermittent asthma, uncomplicated 09/13/2017  . Other seasonal allergic rhinitis 09/13/2017    History obtained from: chart review and patient.  [facility] Primary Care Provider is Darrow Bussing, MD.     Ross is a 47 y.o. male presenting for a follow up visit.  He was last seen in March 2018.  At that time, his asthma was under good control with albuterol as needed.  For his allergic rhinitis, we continue Nasonex were seasons as well as Xyzal as needed.  He has a history of pollen food allergy syndrome and we recommended avoidance of his triggering foods.  We discussed an epinephrine injector, but he preferred to avoid this.  Since the  last visit, he has done well. He reports that he is having some throat pain, which has been ongoing for a 'while". He estimates that it has been going on for around 2-3 months. He has not had fevers. [place] He is seeing his PC in June. This is just on the right side. He also endorses a globus sensation in his upper airway. He denies pain with eating and these symptoms do not occur with a particular food. He has never had food lodged in his esophagus. He has been belching more, but he is drinking more carbonated beverages.   He does tolerate the Xyzal but he actually halves it and this seems to work. He only uses his medications as needed, including the nasal spray. He has fatigue with an entire Xyzal. Jorge's asthma has been well controlled. He has not required rescue medication, experienced nocturnal awakenings due to lower respiratory symptoms, nor have activities of daily living been limited. He has required no Emergency Department or Urgent Care visits for his asthma. He has required zero courses of systemic steroids for asthma exacerbations since the last visit. ACT score today is 25, indicating excellent asthma symptom control. He has not used his albuterol in quite some time.   Otherwise, there have been no changes to his past medical history, surgical history, family history, or social history.    Review of Systems: a 14-point review of systems is pertinent for what is mentioned  in HPI.  Otherwise, all other systems were negative. Constitutional: negative other than that listed in the HPI Eyes: negative other than that listed in the HPI Ears, nose, mouth, throat, and face: negative other than that listed in the HPI Respiratory: negative other than that listed in the HPI Cardiovascular: negative other than that listed in the HPI Gastrointestinal: negative other than that listed in the HPI Genitourinary: negative other than that listed in the  HPI Integument: negative other than that listed in the HPI Hematologic: negative other than that listed in the HPI Musculoskeletal: negative other than that listed in the HPI Neurological: negative other than that listed in the HPI Allergy/Immunologic: negative other than that listed in the HPI    Objective:   Blood pressure 102/68, pulse 86, temperature 98.3 F (36.8 C), temperature source Oral, resp. rate 16, height 5' 8.5" (1.74 m), weight 188 lb (85.3 kg), SpO2 96 %. Body mass index is 28.17 kg/m.   Physical Exam:  General: Alert, interactive, in no acute distress. Eyes: No conjunctival injection bilaterally, no discharge on the right, no discharge on the left and no Horner-Trantas dots present. PERRL bilaterally. EOMI without pain. No photophobia.  Ears: Right TM pearly gray with normal light reflex, Left TM pearly gray with normal light reflex, Right TM intact without perforation and Left TM intact without perforation.  Nose/Throat: External nose within normal limits and septum midline. Turbinates edematous and pale with clear discharge. Posterior oropharynx erythematous without cobblestoning in the posterior oropharynx. Tonsils 2+ without exudates. There are no abnormalities noted in the posterior oropharynx. Tongue without thrush.  Lungs: Clear to auscultation without wheezing, rhonchi or rales. No increased work of breathing. CV: Normal S1/S2. No murmurs. Capillary refill <2 seconds.  Skin: Warm and dry, without lesions or rashes. Neuro:   Grossly intact. No focal deficits appreciated. Responsive to questions.  Diagnostic studies: none     Malachi Bonds, MD  [facility]

## 2017-09-13 NOTE — Patient Instructions (Addendum)
1. Seasonal allergic rhinitis - We will refill your Nasonex to use only during the worst seasons.  - We will refill your Xyzal (levocetirizine).   2. Pollen-food allergy syndrome - Continue to avoid all of your triggering foods.  - Call us if you would like an epinephrine injector.   3. Mild intermittent asthma, uncomplicated - Continue with albuterol as needed. - There is no need for a controller medication at this time.   4. Sore throat - There was nothing interesting seen on the exam. - I would recommend using Zantac twice daily for a week to see if that can help.  5. Return in about 1 year (around 09/14/2018).  Please inform us of any Emergency Department visits, hospitalizations, or changes in symptoms. Call us before going to the ED for breathing or allergy symptoms since we might be able to fit you in for a sick visit. Feel free to contact us anytime with any questions, problems, or concerns.  It was a pleasure to see you again today! Happy spring!   Websites that have reliable patient information: 1. American Academy of Asthma, Allergy, and Immunology: www.aaaai.org 2. Food Allergy Research and Education (FARE): foodallergy.org 3. Mothers of Asthmatics: http://www.asthmacommunitynetwork.org 4. American College of Allergy, Asthma, and Immunology: www.acaai.org

## 2017-10-11 ENCOUNTER — Other Ambulatory Visit: Payer: Self-pay | Admitting: Physician Assistant

## 2017-10-11 DIAGNOSIS — R1310 Dysphagia, unspecified: Secondary | ICD-10-CM

## 2017-10-20 ENCOUNTER — Ambulatory Visit
Admission: RE | Admit: 2017-10-20 | Discharge: 2017-10-20 | Disposition: A | Discharge status: Home / Self Care | Payer: BC Managed Care – PPO | Source: Ambulatory Visit | Attending: Physician Assistant | Admitting: Physician Assistant

## 2017-10-20 DIAGNOSIS — R1310 Dysphagia, unspecified: Secondary | ICD-10-CM

## 2020-10-30 ENCOUNTER — Other Ambulatory Visit: Payer: Self-pay

## 2020-10-30 ENCOUNTER — Encounter: Payer: Self-pay | Admitting: Dermatology

## 2020-10-30 ENCOUNTER — Ambulatory Visit: Payer: BC Managed Care – PPO | Admitting: Dermatology

## 2020-10-30 DIAGNOSIS — L309 Dermatitis, unspecified: Secondary | ICD-10-CM | POA: Diagnosis not present

## 2020-10-30 DIAGNOSIS — L700 Acne vulgaris: Secondary | ICD-10-CM | POA: Diagnosis not present

## 2020-10-30 MED ORDER — CLOBETASOL PROPIONATE 0.05 % EX FOAM: CUTANEOUS | 4 refills | Status: AC

## 2020-10-30 MED ORDER — ARAZLO 0.045 % EX LOTN: TOPICAL_LOTION | CUTANEOUS | 4 refills | Status: AC

## 2020-11-10 ENCOUNTER — Encounter: Payer: Self-pay | Admitting: Dermatology

## 2020-11-10 NOTE — Progress Notes (Signed)
   New Patient   Subjective  Shane Barnett is a 50 y.o. male who presents for the following: Acne (Facial acne x years previous treatment epiduo doxy 100mg  bid, also razor bumps previous treatment desonide).  Breaking out on scalp, face, neck Location:  Duration:  Quality:  Associated Signs/Symptoms: Modifying Factors:  Severity:  Timing: Context:    The following portions of the chart were reviewed this encounter and updated as appropriate:  Tobacco  Allergies  Meds  Problems  Med Hx  Surg Hx  Fam Hx       Objective  Well appearing patient in no apparent distress; mood and affect are within normal limits. Head - Anterior (Face), Scalp Mixed inflammatory acne plus folliculitis; MAY ADD ORAL AFTER THE 10 WEEK TRIAL   Scalp Some scalp folliculitis plus dermatitis and neck bumps from shaving     A focused examination was performed including scalp, face, neck.. Relevant physical exam findings are noted in the Assessment and Plan.   Assessment & Plan  Acne vulgaris Head - Anterior (Face); Scalp  Arazlo applied areas prone to breakout every other night; cautioned about the risk of irritation particularly on the neck.  Related Medications Tazarotene (ARAZLO) 0.045 % LOTN APPLY TO ACNE DAILY TO PRN  Dermatitis Scalp  Clobetasol foam for scalp daily for 3 to 4 weeks; may taper frequency of application if improved.  Follow-up by phone in 1 month.  clobetasol (OLUX) 0.05 % topical foam - Scalp APPLY TO SCALP DAILY TO PRN  Tazarotene (ARAZLO) 0.045 % LOTN - Scalp APPLY TO ACNE DAILY TO PRN

## 2021-01-08 ENCOUNTER — Ambulatory Visit: Payer: BC Managed Care – PPO | Admitting: Dermatology
